# Patient Record
Sex: Female | Born: 1955 | Race: White | Hispanic: No | Marital: Married | Smoking: Never smoker
Health system: Southern US, Community
[De-identification: ages and names within clinical notes are randomized; demographics above are authoritative.]

## PROBLEM LIST (undated history)

## (undated) DIAGNOSIS — C4499 Other specified malignant neoplasm of skin, unspecified: Secondary | ICD-10-CM

## (undated) DIAGNOSIS — C439 Malignant melanoma of skin, unspecified: Secondary | ICD-10-CM

## (undated) DIAGNOSIS — E039 Hypothyroidism, unspecified: Secondary | ICD-10-CM

## (undated) DIAGNOSIS — N200 Calculus of kidney: Secondary | ICD-10-CM

## (undated) DIAGNOSIS — H409 Unspecified glaucoma: Secondary | ICD-10-CM

## (undated) DIAGNOSIS — E119 Type 2 diabetes mellitus without complications: Secondary | ICD-10-CM

## (undated) DIAGNOSIS — N8502 Endometrial intraepithelial neoplasia [EIN]: Secondary | ICD-10-CM

## (undated) DIAGNOSIS — Z803 Family history of malignant neoplasm of breast: Secondary | ICD-10-CM

## (undated) DIAGNOSIS — I10 Essential (primary) hypertension: Secondary | ICD-10-CM

## (undated) DIAGNOSIS — N816 Rectocele: Secondary | ICD-10-CM

## (undated) HISTORY — DX: Malignant melanoma of skin, unspecified: C43.9

## (undated) HISTORY — PX: OTHER SURGICAL HISTORY: SHX169

## (undated) HISTORY — DX: Unspecified glaucoma: H40.9

## (undated) HISTORY — PX: ABDOMINAL HYSTERECTOMY: SHX81

## (undated) HISTORY — DX: Other specified malignant neoplasm of skin, unspecified: C44.99

## (undated) HISTORY — DX: Calculus of kidney: N20.0

## (undated) HISTORY — DX: Family history of malignant neoplasm of breast: Z80.3

## (undated) HISTORY — DX: Hypothyroidism, unspecified: E03.9

## (undated) HISTORY — DX: Rectocele: N81.6

## (undated) HISTORY — PX: OOPHORECTOMY: SHX86

## (undated) HISTORY — DX: Essential (primary) hypertension: I10

---
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
## 2002-04-30 ENCOUNTER — Encounter: Payer: Self-pay | Admitting: Unknown Physician Specialty

## 2002-04-30 ENCOUNTER — Encounter: Admission: RE | Admit: 2002-04-30 | Discharge: 2002-04-30 | Payer: Self-pay | Admitting: Unknown Physician Specialty

## 2002-05-30 ENCOUNTER — Encounter: Payer: Self-pay | Admitting: Neurosurgery

## 2002-06-02 ENCOUNTER — Ambulatory Visit (HOSPITAL_COMMUNITY): Admission: RE | Admit: 2002-06-02 | Discharge: 2002-06-02 | Payer: Self-pay | Admitting: Neurosurgery

## 2002-06-02 ENCOUNTER — Encounter: Payer: Self-pay | Admitting: Neurosurgery

## 2002-06-02 HISTORY — PX: NECK SURGERY: SHX720

## 2005-01-01 ENCOUNTER — Ambulatory Visit: Payer: Self-pay

## 2005-08-27 ENCOUNTER — Emergency Department: Payer: Self-pay | Admitting: Emergency Medicine

## 2005-10-10 ENCOUNTER — Ambulatory Visit: Payer: Self-pay

## 2005-10-22 ENCOUNTER — Ambulatory Visit: Payer: Self-pay | Admitting: Specialist

## 2005-11-04 ENCOUNTER — Ambulatory Visit: Payer: Self-pay | Admitting: Specialist

## 2005-11-24 ENCOUNTER — Ambulatory Visit: Payer: Self-pay | Admitting: Specialist

## 2006-01-21 ENCOUNTER — Ambulatory Visit: Payer: Self-pay | Admitting: Unknown Physician Specialty

## 2007-01-27 ENCOUNTER — Ambulatory Visit: Payer: Self-pay

## 2008-02-02 ENCOUNTER — Ambulatory Visit: Payer: Self-pay

## 2009-02-07 ENCOUNTER — Ambulatory Visit: Payer: Self-pay

## 2010-02-13 ENCOUNTER — Ambulatory Visit: Payer: Self-pay

## 2011-02-19 ENCOUNTER — Ambulatory Visit: Payer: Self-pay

## 2011-02-21 ENCOUNTER — Ambulatory Visit: Payer: Self-pay

## 2012-02-25 ENCOUNTER — Ambulatory Visit: Payer: Self-pay

## 2013-01-06 HISTORY — PX: COLONOSCOPY: SHX174

## 2013-01-07 ENCOUNTER — Ambulatory Visit: Payer: Self-pay | Admitting: Gastroenterology

## 2013-01-10 LAB — PATHOLOGY REPORT

## 2013-03-02 ENCOUNTER — Ambulatory Visit: Payer: Self-pay | Admitting: Family Medicine

## 2014-01-18 LAB — HM PAP SMEAR: HM PAP: NEGATIVE

## 2014-03-03 ENCOUNTER — Ambulatory Visit: Payer: Self-pay | Admitting: Family Medicine

## 2015-01-24 ENCOUNTER — Other Ambulatory Visit: Payer: Self-pay | Admitting: Family Medicine

## 2015-01-24 DIAGNOSIS — Z1231 Encounter for screening mammogram for malignant neoplasm of breast: Secondary | ICD-10-CM

## 2015-03-14 ENCOUNTER — Encounter (INDEPENDENT_AMBULATORY_CARE_PROVIDER_SITE_OTHER): Payer: Self-pay

## 2015-03-14 ENCOUNTER — Ambulatory Visit
Admission: RE | Admit: 2015-03-14 | Discharge: 2015-03-14 | Disposition: A | Discharge status: Home / Self Care | Payer: BLUE CROSS/BLUE SHIELD | Source: Ambulatory Visit | Attending: Family Medicine | Admitting: Family Medicine

## 2015-03-14 DIAGNOSIS — Z1231 Encounter for screening mammogram for malignant neoplasm of breast: Secondary | ICD-10-CM | POA: Insufficient documentation

## 2016-01-18 ENCOUNTER — Other Ambulatory Visit: Payer: Self-pay | Admitting: Family Medicine

## 2016-01-18 DIAGNOSIS — Z1231 Encounter for screening mammogram for malignant neoplasm of breast: Secondary | ICD-10-CM

## 2016-01-30 DIAGNOSIS — Z7189 Other specified counseling: Secondary | ICD-10-CM | POA: Insufficient documentation

## 2016-01-30 DIAGNOSIS — Z7185 Encounter for immunization safety counseling: Secondary | ICD-10-CM | POA: Insufficient documentation

## 2016-03-14 ENCOUNTER — Ambulatory Visit
Admission: RE | Admit: 2016-03-14 | Discharge: 2016-03-14 | Disposition: A | Discharge status: Home / Self Care | Payer: BLUE CROSS/BLUE SHIELD | Source: Ambulatory Visit | Attending: Family Medicine | Admitting: Family Medicine

## 2016-03-14 ENCOUNTER — Other Ambulatory Visit: Payer: Self-pay | Admitting: Family Medicine

## 2016-03-14 DIAGNOSIS — R928 Other abnormal and inconclusive findings on diagnostic imaging of breast: Secondary | ICD-10-CM | POA: Insufficient documentation

## 2016-03-14 DIAGNOSIS — Z1231 Encounter for screening mammogram for malignant neoplasm of breast: Secondary | ICD-10-CM

## 2016-03-14 LAB — HM MAMMOGRAPHY

## 2016-03-18 ENCOUNTER — Other Ambulatory Visit: Payer: Self-pay | Admitting: Family Medicine

## 2016-03-18 DIAGNOSIS — N6320 Unspecified lump in the left breast, unspecified quadrant: Secondary | ICD-10-CM

## 2016-03-31 ENCOUNTER — Ambulatory Visit
Admission: RE | Admit: 2016-03-31 | Discharge: 2016-03-31 | Disposition: A | Discharge status: Home / Self Care | Payer: BLUE CROSS/BLUE SHIELD | Source: Ambulatory Visit | Attending: Family Medicine | Admitting: Family Medicine

## 2016-03-31 DIAGNOSIS — N63 Unspecified lump in breast: Secondary | ICD-10-CM | POA: Diagnosis present

## 2016-03-31 DIAGNOSIS — R928 Other abnormal and inconclusive findings on diagnostic imaging of breast: Secondary | ICD-10-CM | POA: Insufficient documentation

## 2016-03-31 DIAGNOSIS — N6320 Unspecified lump in the left breast, unspecified quadrant: Secondary | ICD-10-CM

## 2016-05-28 LAB — HM PAP SMEAR: HM Pap smear: NEGATIVE

## 2016-06-13 DIAGNOSIS — C439 Malignant melanoma of skin, unspecified: Secondary | ICD-10-CM

## 2016-06-13 HISTORY — DX: Malignant melanoma of skin, unspecified: C43.9

## 2016-07-10 ENCOUNTER — Other Ambulatory Visit: Payer: Self-pay | Admitting: Family Medicine

## 2016-07-14 HISTORY — PX: VULVA /PERINEUM BIOPSY: SHX319

## 2016-08-19 ENCOUNTER — Other Ambulatory Visit: Payer: Self-pay | Admitting: Family Medicine

## 2016-08-19 DIAGNOSIS — R922 Inconclusive mammogram: Secondary | ICD-10-CM

## 2016-09-25 ENCOUNTER — Ambulatory Visit
Admission: RE | Admit: 2016-09-25 | Discharge: 2016-09-25 | Disposition: A | Discharge status: Home / Self Care | Payer: BLUE CROSS/BLUE SHIELD | Source: Ambulatory Visit | Attending: Family Medicine | Admitting: Family Medicine

## 2016-09-25 DIAGNOSIS — Z85820 Personal history of malignant melanoma of skin: Secondary | ICD-10-CM | POA: Insufficient documentation

## 2016-09-25 DIAGNOSIS — Z803 Family history of malignant neoplasm of breast: Secondary | ICD-10-CM | POA: Insufficient documentation

## 2016-09-25 DIAGNOSIS — R922 Inconclusive mammogram: Secondary | ICD-10-CM | POA: Insufficient documentation

## 2016-10-29 DIAGNOSIS — Z6835 Body mass index (BMI) 35.0-35.9, adult: Secondary | ICD-10-CM

## 2016-10-29 DIAGNOSIS — L719 Rosacea, unspecified: Secondary | ICD-10-CM | POA: Insufficient documentation

## 2016-10-29 DIAGNOSIS — G4730 Sleep apnea, unspecified: Secondary | ICD-10-CM | POA: Insufficient documentation

## 2016-10-29 DIAGNOSIS — I10 Essential (primary) hypertension: Secondary | ICD-10-CM | POA: Insufficient documentation

## 2016-10-29 DIAGNOSIS — E6609 Other obesity due to excess calories: Secondary | ICD-10-CM | POA: Insufficient documentation

## 2016-10-29 DIAGNOSIS — E7800 Pure hypercholesterolemia, unspecified: Secondary | ICD-10-CM | POA: Insufficient documentation

## 2016-10-29 DIAGNOSIS — H409 Unspecified glaucoma: Secondary | ICD-10-CM | POA: Insufficient documentation

## 2016-10-29 DIAGNOSIS — E039 Hypothyroidism, unspecified: Secondary | ICD-10-CM | POA: Insufficient documentation

## 2016-10-30 ENCOUNTER — Ambulatory Visit (INDEPENDENT_AMBULATORY_CARE_PROVIDER_SITE_OTHER): Payer: BLUE CROSS/BLUE SHIELD | Admitting: Obstetrics and Gynecology

## 2016-10-30 ENCOUNTER — Encounter: Payer: Self-pay | Admitting: Obstetrics and Gynecology

## 2016-10-30 VITALS — BP 126/52 | HR 77 | Ht 60.0 in | Wt 189.0 lb

## 2016-10-30 DIAGNOSIS — K5901 Slow transit constipation: Secondary | ICD-10-CM | POA: Diagnosis not present

## 2016-10-30 DIAGNOSIS — N760 Acute vaginitis: Secondary | ICD-10-CM | POA: Diagnosis not present

## 2016-10-30 LAB — POCT WET PREP WITH KOH
Clue Cells Wet Prep HPF POC: NEGATIVE
KOH PREP POC: NEGATIVE
Trichomonas, UA: NEGATIVE
YEAST WET PREP PER HPF POC: NEGATIVE

## 2016-10-30 MED ORDER — CLOTRIMAZOLE-BETAMETHASONE 1-0.05 % EX CREA: 1.0000 "application " | TOPICAL_CREAM | Freq: Two times a day (BID) | CUTANEOUS | 0 refills | Status: DC

## 2016-10-30 NOTE — Progress Notes (Signed)
Chief Complaint  Patient presents with  . Vaginitis    vaginal burning/itching   . Pelvic Pain     HPI:      Ms. Laura Crane is a 61 y.o. G1P0010 who LMP was No LMP recorded. Patient is postmenopausal., presents today for vaginal irritation/itch/redness off and on for the past few wks. No increased d/c, odor. She has done 2 rounds of abx since 1/18. She has not used any meds to treat. She was eval by PCP for LLQ pain/hematuria with neg eval. Pt still has occas BLQ cramping but is having constipation now (after abx use). Sx improved with metamucil use.  Past Medical History:  Diagnosis Date  . Glaucoma   . Hypertension   . Hypothyroidism     Past Surgical History:  Procedure Laterality Date  . COLONOSCOPY  01/06/2013   3 polyps  . NECK SURGERY  06/02/2002   two discs replaced    Family History  Problem Relation Age of Onset  . Breast cancer Sister   . Breast cancer Sister 95  . Breast cancer Paternal Aunt 68  . Hypertension Mother   . Hypertension Father   . Diabetes Brother   . Migraines Brother      ROS:  Review of Systems  Constitutional: Negative for fever, malaise/fatigue and weight loss.  Gastrointestinal: Positive for constipation. Negative for blood in stool, diarrhea, nausea and vomiting.  Genitourinary: Negative for dysuria, flank pain, frequency, hematuria and urgency.  Musculoskeletal: Negative for back pain.  Skin: Positive for itching and rash.    Objective: BP (!) 126/52 (BP Location: Left Arm, Patient Position: Sitting, Cuff Size: Normal)   Pulse 77   Ht 5' (1.524 m)   Wt 189 lb (85.7 kg)   BMI 36.91 kg/m    Physical Exam  Genitourinary: Vagina normal and uterus normal.  There is rash and tenderness on the right labia. There is no lesion or Bartholin's cyst on the right labia.  There is rash and tenderness on the left labia. There is no lesion or Bartholin's cyst on the left labia. No erythema, tenderness or bleeding in the vagina. No  vaginal discharge found. Right adnexum does not display mass and does not display tenderness. Left adnexum does not display mass and does not display tenderness. Cervix does not exhibit motion tenderness, discharge or friability. Uterus is not enlarged or tender.  Nursing note and vitals reviewed.   Results: Results for orders placed or performed in visit on 10/30/16 (from the past 24 hour(s))  POCT Wet Prep with KOH     Status: Normal   Collection Time: 10/30/16 10:32 AM  Result Value Ref Range   Trichomonas, UA Negative    Clue Cells Wet Prep HPF POC neg    Epithelial Wet Prep HPF POC  Few, Moderate, Many, Too numerous to count   Yeast Wet Prep HPF POC neg    Bacteria Wet Prep HPF POC  Few   RBC Wet Prep HPF POC     WBC Wet Prep HPF POC     KOH Prep POC Negative Negative     Assessment/Plan:  Acute vaginitis - Neg wet prep/pos exam. Rx clotrimazole/betamethasone crm/cold compresses. F/u prn.  - Plan: clotrimazole-betamethasone (LOTRISONE) cream, POCT Wet Prep with KOH  Slow transit constipation - Cont metamucil. Add probiotics for 1 mo since completed 2 doses abx recently.    F/U  Return if symptoms worsen or fail to improve.  Amal Renbarger B. Elyanna Wallick, PA-C 10/30/2016 10:34 AM

## 2017-02-11 DIAGNOSIS — C4499 Other specified malignant neoplasm of skin, unspecified: Secondary | ICD-10-CM

## 2017-02-11 HISTORY — DX: Other specified malignant neoplasm of skin, unspecified: C44.99

## 2017-03-04 ENCOUNTER — Ambulatory Visit (INDEPENDENT_AMBULATORY_CARE_PROVIDER_SITE_OTHER): Payer: BLUE CROSS/BLUE SHIELD | Admitting: Certified Nurse Midwife

## 2017-03-04 ENCOUNTER — Encounter: Payer: Self-pay | Admitting: Certified Nurse Midwife

## 2017-03-04 VITALS — BP 108/62 | HR 71 | Ht 61.75 in | Wt 182.0 lb

## 2017-03-04 DIAGNOSIS — C4499 Other specified malignant neoplasm of skin, unspecified: Secondary | ICD-10-CM | POA: Diagnosis not present

## 2017-03-04 DIAGNOSIS — N816 Rectocele: Secondary | ICD-10-CM | POA: Diagnosis not present

## 2017-03-04 DIAGNOSIS — D0361 Melanoma in situ of right upper limb, including shoulder: Secondary | ICD-10-CM | POA: Insufficient documentation

## 2017-03-04 DIAGNOSIS — N200 Calculus of kidney: Secondary | ICD-10-CM | POA: Insufficient documentation

## 2017-03-04 NOTE — Patient Instructions (Signed)
About Rectocele  Overview  A rectocele is a type of hernia which causes different degrees of bulging of the rectal tissues into the vaginal wall.  You may even notice that it presses against the vaginal wall so much that some vaginal tissues droop outside of the opening of your vagina.  Causes of Rectocele  The most common cause is childbirth.  The muscles and ligaments in the pelvis that hold up and support the female organs and vagina become stretched and weakened during labor and delivery.  The more babies you have, the more the support tissues are stretched and weakened.  Not everyone who has a baby will develop a rectocele.  Some women have stronger supporting tissue in the pelvis and may not have as much of a problem as others.  Women who have a Cesarean section usually do not get rectocele's unless they pushed a long time prior to the cesarean delivery.  Other conditions that can cause a rectocele include chronic constipation, a chronic cough, a lot of heavy lifting, and obesity.  Older women may have this problem because the loss of female hormones causes the vaginal tissue to become weaker.  Symptoms  There may not be any symptoms.  If you do have symptoms, they may include:  Pelvic pressure in the rectal area  Protrusion of the lower part of the vagina through the opening of the vagina  Constipation and trapping of the stool, making it difficult to have a bowel movement.  In severe cases, you may have to press on the lower part of your vagina to help push the stool out of you rectum.  This is called splinting to empty.  Diagnosing Rectocele  Your health care provider will ask about your symptoms and perform a pelvic exam.  S/he will ask you to bear down, pushing like you are having a bowel movement so as to see how far the lower part of the vagina protrudes into the vagina and possible outside of the vagina.  Your provider will also ask you to contract the muscles of your pelvis  (like you are stopping the stream in the middle of urinating) to determine the strength of your pelvic muscles.  Your provider may also do a rectal exam.  Treatment Options  If you do not have any symptoms, no treatment may be necessary.  Other treatment options include:  Pelvic floor exercises: Contracting the muscles in your genital area may help strengthen your muscles and support the organs.  Be sure to get proper exercise instruction from you physical therapist.  A pessary (removealbe pelvic support device) sometimes helps rectocele symptoms.  Surgery: Surgical repair may be necessary. In some cases the uterus may need to be taken out ( a hysterectomy) as well.  There are many types of surgery for pelvic support problems.  Look for physicians who specialize in repair procedures.  You can take care of yourself by:  Treating and preventing constipation  Avoiding heavy lifting, and lifting correctly (with your legs, not with you waist or back)  Treating a chronic cough or bronchitis  Not smoking  avoiding too much weight gain  Doing pelvic floor exercises   2007, [employer] Doc.33 Pelvic Organ Prolapse Pelvic organ prolapse is the stretching, bulging, or dropping of pelvic organs into an abnormal position. It happens when the muscles and tissues that surround and support pelvic structures are stretched or weak. Pelvic organ prolapse can involve:  Vagina (vaginal prolapse).  Uterus (uterine prolapse).  Bladder (  cystocele).  Rectum (rectocele).  Intestines (enterocele).  When organs other than the vagina are involved, they often bulge into the vagina or protrude from the vagina, depending on how severe the prolapse is. What are the causes? Causes of this condition include:  Pregnancy, labor, and childbirth.  Long-lasting (chronic) cough.  Chronic constipation.  Obesity.  Past pelvic surgery.  Aging. During and after menopause, a decreased  production of the hormone estrogen can weaken pelvic ligaments and muscles.  Consistently lifting more than 50 lb (23 kg).  Buildup of fluid in the abdomen due to certain diseases and other conditions.  What are the signs or symptoms? Symptoms of this condition include:  Loss of bladder control when you cough, sneeze, strain, and exercise (stress incontinence). This may be worse immediately following childbirth, and it may gradually improve over time.  Feeling pressure in your pelvis or vagina. This pressure may increase when you cough or when you are having a bowel movement.  A bulge that protrudes from the opening of your vagina or against your vaginal wall. If your uterus protrudes through the opening of your vagina and rubs against your clothing, you may also experience soreness, ulcers, infection, pain, and bleeding.  Increased effort to have a bowel movement or urinate.  Pain in your low back.  Pain, discomfort, or disinterest in sexual intercourse.  Repeated bladder infections (urinary tract infections).  Difficulty inserting or inability to insert a tampon or applicator.  In some people, this condition does not cause any symptoms. How is this diagnosed? Your health care provider may perform an internal and external vaginal and rectal exam. During the exam, you may be asked to cough and strain while you are lying down, sitting, and standing up. Your health care provider will determine if other tests are required, such as bladder function tests. How is this treated? In most cases, this condition needs to be treated only if it produces symptoms. No treatment is guaranteed to correct the prolapse or relieve the symptoms completely. Treatment may include:  Lifestyle changes, such as: ? Avoiding drinking beverages that contain caffeine. ? Increasing your intake of high-fiber foods. This can help to decrease constipation and straining during bowel movements. ? Emptying your bladder  at scheduled times (bladder training therapy). This can help to reduce or avoid urinary incontinence. ? Losing weight if you are overweight or obese.  Estrogen. Estrogen may help mild prolapse by increasing the strength and tone of pelvic floor muscles.  Kegel exercises. These may help mild cases of prolapse by strengthening and tightening the muscles of the pelvic floor.  Pessary insertion. A pessary is a soft, flexible device that is placed into your vagina by your health care provider to help support the vaginal walls and keep pelvic organs in place.  Surgery. This is often the only form of treatment for severe prolapse. Different types of surgeries are available.  Follow these instructions at home:  Wear a sanitary pad or absorbent product if you have urinary incontinence.  Avoid heavy lifting and straining with exercise and work. Do not hold your breath when you perform mild to moderate lifting and exercise activities. Limit your activities as directed by your health care provider.  Take medicines only as directed by your health care provider.  Perform Kegel exercises as directed by your health care provider.  If you have a pessary, take care of it as directed by your health care provider. Contact a health care provider if:  Your symptoms interfere  with your daily activities or sex life.  You need medicine to help with the discomfort.  You notice bleeding from the vagina that is not related to your period.  You have a fever.  You have pain or bleeding when you urinate.  You have bleeding when you have a bowel movement.  You lose urine when you have sex.  You have chronic constipation.  You have a pessary that falls out.  You have vaginal discharge that has a bad smell.  You have low abdominal pain or cramping that is unusual for you. This information is not intended to replace advice given to you by your health care provider. Make sure you discuss any questions you  have with your health care provider. Document Released: 01/25/2014 Document Revised: 12/06/2015 Document Reviewed: 09/12/2013 Elsevier Interactive Patient Education  [employer].

## 2017-03-04 NOTE — Progress Notes (Signed)
Obstetrics & Gynecology Office Visit   Chief Complaint:  Chief Complaint  Patient presents with  . 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 Her symptoms began in March with vulvar itching and redness following antibiotic usage. Her sx did not improve with topical clobetasol. She then was seen at a dermatologist who biopsied the right labia and it returned extramammary Paget's. She is seeing Dr Thurston Pounds at [facility], gyn onc, and is scheduled for a partial vulvectomy next week. She reports that her vulva burns most of the time. A urinalysis done yesterday was negative for a UTI. Her last annual exam was 05/2016 She has had a colonoscopy 4 years ago (3 polyps removed) and is due for another next year. Her mammogram was done 03/2016 and she had additional views of the left breast for a lesion thought to be a benign fat necrosis. 6 mos follow up studies were also done in March. She is due for a annual screening mammogram next month. She reports having a wide excision of a melanoma of her right arm December 2017 and she passed kidney stones in Jan 2018.  She states that she noticed this bulging at her vaginal opening recently and wanted to know what it was. She has been having problems with constipation and bilateral lower abdominal cramping since earlier in the year.    Review of Systems:  ROS -see HPI  Past Medical History:  Past Medical History:  Diagnosis Date  . Extramammary Paget disease 02/2017   having surgery to remove 03/11/17 from genital area  . Glaucoma   . Hypertension   . Hypothyroidism   . Kidney stones   . Melanoma ([place]) 06/2016   right arm    Past Surgical History:  Past Surgical History:  Procedure Laterality Date  . COLONOSCOPY  01/06/2013   3 polyps  . excision  of melanoma Right    right arm  . NECK SURGERY  06/02/2002   two discs replaced  . VULVA /PERINEUM BIOPSY  2018   extramammary Paget's    Gynecologic History: No LMP recorded. Patient is postmenopausal.   Family History:  Family History  Problem Relation Age of Onset  . Breast cancer Sister 77  . Breast cancer Paternal Aunt 12  . Hypertension Mother   . Kidney failure Mother   . Hypertension Father   . Lung cancer Father   . Diabetes Brother   . Migraines Brother   . Hypertension Brother   . Heart attack Sister     Social History:  Social History   Social History  . Marital status: Married    Spouse name: N/A  . Number of children: N/A  . Years of education: N/A   Occupational History  . Not on file.   Social History Main Topics  . Smoking status: Never Smoker  . Smokeless tobacco: Never Used  . Alcohol use No  . Drug use: No  . Sexual activity: No   Other Topics Concern  . Not on file   Social History Narrative  . No narrative on file    Allergies:  Allergies  Allergen Reactions  . Azithromycin Swelling  . Doxycycline Anaphylaxis  . Doxycycline Calcium Anaphylaxis  . Erythromycin Other (See Comments)    Caused knee & lip  swelling  . Hydrochlorothiazide Other (See Comments)    Rash on tongue  . Maprotiline     Other reaction(s): Unknown  . Amlodipine Rash  . Brimonidine Itching  . Macrolides And Ketolides Rash and Other (See Comments)  . Penicillin V Potassium Rash  . Penicillins Rash  . Sulfa Antibiotics Rash  . Tetracyclines & Related Rash  . Troleandomycin Rash    Medications: Prior to Admission medications   Medication Sig Start Date End Date Taking? Authorizing Provider  aspirin EC 81 MG tablet Take by mouth.   Yes [provider]  bimatoprost (LUMIGAN) 0.01 % SOLN 1 drop at bedtime.   Yes [provider]  levothyroxine (SYNTHROID, LEVOTHROID) 100 MCG tablet Take by mouth.   Yes [provider]  lisinopril  (PRINIVIL,ZESTRIL) 10 MG tablet Take by mouth. 09/04/16  Yes [provider]  Multiple Vitamins-Minerals (PRESERVISION AREDS 2+MULTI VIT PO) Take by mouth.   Yes [provider]  Omega-3 Fatty Acids (FISH OIL) 1000 MG CAPS Take by mouth.   Yes [provider]  Red Yeast Rice Extract 600 MG CAPS Take by mouth.   Yes [provider]  timolol (BETIMOL) 0.5 % ophthalmic solution Apply to eye.   Yes [provider]  verapamil (CALAN-SR) 240 MG CR tablet Take by mouth. 09/04/16  Yes [provider]    Physical Exam Vitals:BP 108/62   Pulse 71   Ht 5' 1.75" (1.568 m)   Wt 182 lb (82.6 kg)   BMI 33.56 kg/m    Physical Exam  Constitutional: She is oriented to person, place, and time. She appears well-developed and well-nourished. No distress.  Genitourinary:  Genitourinary Comments: Vulva: external labia majora appear red; there is a ulcerated lesion at the top of the Inner right labia majora. Vagina: rectocele noted with Valsalva Cervix: no lesions  Neurological: She is alert and oriented to person, place, and time.  Skin: Skin is warm and dry.  Psychiatric: She has a normal mood and affect. Her behavior is normal.     Assessment: 61 y.o. G1P0010 postmenopausal female with rectocele/ constipation. I do not think that her vulvar discomfort is from her rectocele, but rather from the Paget's   Extramammary Paget's-scheduled for a vulvectomy next week.   Plan: Discussed medical treatment of rectocele and constipation with Benefiber daily at this time. Can also do Kegel exercises. Further work up for underlying carcinomas associated with Paget's disease per GYN Oncologist. Advised patient to use cool soaks on her vulva or sitz baths to see if it will help her vulvar discomfort  Laura Crane, CNM

## 2017-03-09 ENCOUNTER — Other Ambulatory Visit: Payer: Self-pay | Admitting: Family Medicine

## 2017-03-09 DIAGNOSIS — Z1231 Encounter for screening mammogram for malignant neoplasm of breast: Secondary | ICD-10-CM

## 2017-03-11 HISTORY — PX: OTHER SURGICAL HISTORY: SHX169

## 2017-04-01 ENCOUNTER — Ambulatory Visit
Admission: RE | Admit: 2017-04-01 | Discharge: 2017-04-01 | Disposition: A | Discharge status: Home / Self Care | Payer: BLUE CROSS/BLUE SHIELD | Source: Ambulatory Visit | Attending: Family Medicine | Admitting: Family Medicine

## 2017-04-01 DIAGNOSIS — Z1231 Encounter for screening mammogram for malignant neoplasm of breast: Secondary | ICD-10-CM | POA: Insufficient documentation

## 2017-04-27 ENCOUNTER — Ambulatory Visit
Admission: RE | Admit: 2017-04-27 | Discharge: 2017-04-27 | Disposition: A | Discharge status: Home / Self Care | Payer: BLUE CROSS/BLUE SHIELD | Source: Ambulatory Visit | Attending: Family Medicine | Admitting: Family Medicine

## 2017-04-27 ENCOUNTER — Other Ambulatory Visit: Payer: Self-pay | Admitting: Family Medicine

## 2017-04-27 DIAGNOSIS — M4316 Spondylolisthesis, lumbar region: Secondary | ICD-10-CM | POA: Diagnosis not present

## 2017-04-27 DIAGNOSIS — K5730 Diverticulosis of large intestine without perforation or abscess without bleeding: Secondary | ICD-10-CM | POA: Diagnosis not present

## 2017-04-27 DIAGNOSIS — R109 Unspecified abdominal pain: Secondary | ICD-10-CM

## 2017-04-27 DIAGNOSIS — N3289 Other specified disorders of bladder: Secondary | ICD-10-CM | POA: Insufficient documentation

## 2017-04-27 DIAGNOSIS — R935 Abnormal findings on diagnostic imaging of other abdominal regions, including retroperitoneum: Secondary | ICD-10-CM | POA: Diagnosis not present

## 2017-04-27 MED ORDER — IOPAMIDOL (ISOVUE-300) INJECTION 61%
100.0000 mL | Freq: Once | INTRAVENOUS | Status: AC | PRN
  Administered 2017-04-27: 100 mL via INTRAVENOUS

## 2017-05-19 ENCOUNTER — Ambulatory Visit: Payer: Self-pay | Admitting: Urology

## 2017-05-21 ENCOUNTER — Ambulatory Visit: Payer: Self-pay | Admitting: Urology

## 2017-06-29 HISTORY — PX: COLONOSCOPY W/ POLYPECTOMY: SHX1380

## 2017-08-17 HISTORY — PX: RECTAL PROLAPSE REPAIR: SHX759

## 2017-11-16 ENCOUNTER — Encounter: Payer: Self-pay | Admitting: Certified Nurse Midwife

## 2017-11-16 ENCOUNTER — Ambulatory Visit (INDEPENDENT_AMBULATORY_CARE_PROVIDER_SITE_OTHER): Payer: BLUE CROSS/BLUE SHIELD | Admitting: Certified Nurse Midwife

## 2017-11-16 VITALS — BP 142/82 | HR 75 | Ht 61.0 in | Wt 195.0 lb

## 2017-11-16 DIAGNOSIS — Z1231 Encounter for screening mammogram for malignant neoplasm of breast: Secondary | ICD-10-CM | POA: Diagnosis not present

## 2017-11-16 DIAGNOSIS — Z124 Encounter for screening for malignant neoplasm of cervix: Secondary | ICD-10-CM | POA: Diagnosis not present

## 2017-11-16 DIAGNOSIS — Z0000 Encounter for general adult medical examination without abnormal findings: Secondary | ICD-10-CM | POA: Diagnosis not present

## 2017-11-16 DIAGNOSIS — Z01419 Encounter for gynecological examination (general) (routine) without abnormal findings: Secondary | ICD-10-CM

## 2017-11-16 DIAGNOSIS — Z1239 Encounter for other screening for malignant neoplasm of breast: Secondary | ICD-10-CM

## 2017-11-16 NOTE — Progress Notes (Signed)
Gynecology Annual Exam  PCP: Katheren Shams  Chief Complaint:  Chief Complaint  Patient presents with  . Gynecologic Exam    History of Present Illness:Laura Crane presents today for her annual exam. She is a 62 year old Caucasian/White female , G 0 P 0 0 0 0 , who is postmenopausal . 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 She did not need radiation of chemotherapy. In February of this year she also had a rectocele repair (posterior colporrhaphy) and feels like she has healed well since her surgeries. Her menses are absent and she is postmenopausal. She does have occasional hot flashes.  She has had no spotting.   The patient's past medical history is also remarkable for hypothyroidism, high blood pressure, and glaucoma.  Since her last annual GYN exam dated 05/28/2016, she has had a melanoma removed from her right upper arm in December 2017 and passed a kidney stone in January 2018.[name] She is not sexually active.   Her most recent pap smear was obtained 05/28/2016 and was NIL.  Her most recent mammogram obtained on 04/01/2017 was negative. Her prior mammogram in 2017 revealed an abnormality (as described in the report) and Additional views 03/31/2016 reveal a fat necosis in the left breast. 61mos follow up mammogram was Bir[facility] 2 and stable.  There is a positive history of breast cancer in her sister and paternal aunt. Genetic testing has not been done. There is no family history of ovarian cancer. The patient does not do monthly self breast exams.  She had a colonoscopy in 06/29/2017. Results: tubular adenoma. Her next colonoscopy is due in 5 years.  She denies a recent DEXA scan and is eligible.  The patient does not smoke.  The patient does not drink alcohol.  The patient does not use illegal drugs.  The patient exercises on the treadmill three times a week for 20-30 minutes and works in the yard. The  patient does get adequate calcium in her diet.  She had a recent cholesterol screen in 2018 by Dr Richarda Overlie that was OK, except for a low HDL.   Review of Systems: Review of Systems  Constitutional: Positive for malaise/fatigue. Negative for chills, fever and weight loss.  HENT: Negative for congestion, sinus pain and sore throat.   Eyes: Negative for blurred vision and pain.  Respiratory: Negative for hemoptysis, shortness of breath and wheezing.   Cardiovascular: Negative for chest pain, palpitations and leg swelling.  Gastrointestinal: Negative for abdominal pain, blood in stool, diarrhea, heartburn, nausea and vomiting.  Genitourinary: Negative for dysuria, frequency, hematuria and urgency.  Musculoskeletal: Negative for back pain, joint pain and myalgias.  Skin: Negative for itching and rash.  Neurological: Negative for dizziness, tingling and headaches.  Endo/Heme/Allergies: Positive for environmental allergies (with coughing and sneezing). Negative for polydipsia. Bruises/bleeds easily.       Negative for hirsutism   Psychiatric/Behavioral: Negative for depression. The patient is not nervous/anxious and does not have insomnia.     Past Medical History:  Past Medical History:  Diagnosis Date  . Extramammary Paget disease 02/2017   having surgery to remove 03/11/17 from genital area  . Glaucoma   . Hypertension   . Hypothyroidism   . Kidney stones   . Melanoma ([place]) 06/2016   right arm  . Rectocele     Past Surgical History:  Past Surgical History:  Procedure Laterality Date  . COLONOSCOPY  01/06/2013  3 polyps  . COLONOSCOPY W/ POLYPECTOMY  06/29/2017   tubular adenoma-next colonoscopy in 5 years  . excision of melanoma Right    right arm  . NECK SURGERY  06/02/2002   two discs replaced  . RECTAL PROLAPSE REPAIR  08/17/2017  . Simple partial vulvectomy  03/11/2017  . VULVA /PERINEUM BIOPSY  2018   extramammary Paget's    Family History:  Family History    Problem Relation Age of Onset  . Breast cancer Sister 25  . Breast cancer Paternal Aunt 2  . Hypertension Mother   . Kidney failure Mother   . Hypertension Father   . Lung cancer Father   . Diabetes Brother   . Migraines Brother   . Hypertension Brother   . Heart attack Sister     Social History:  Social History   Socioeconomic History  . Marital status: Married    Spouse name: Not on file  . Number of children: Not on file  . Years of education: Not on file  . Highest education level: Not on file  Occupational History  . Not on file  Social Needs  . Financial resource strain: Not on file  . Food insecurity:    Worry: Not on file    Inability: Not on file  . Transportation needs:    Medical: Not on file    Non-medical: Not on file  Tobacco Use  . Smoking status: Never Smoker  . Smokeless tobacco: Never Used  Substance and Sexual Activity  . Alcohol use: No  . Drug use: No  . Sexual activity: Not Currently  Lifestyle  . Physical activity:    Days per week: 3 days    Minutes per session: 30 min  . Stress: Only a little  Relationships  . Social connections:    Talks on phone: Not on file    Gets together: Not on file    Attends religious service: Not on file    Active member of club or organization: Not on file    Attends meetings of clubs or organizations: Not on file    Relationship status: Not on file  . Intimate partner violence:    Fear of current or ex partner: Not on file    Emotionally abused: Not on file    Physically abused: Not on file    Forced sexual activity: Not on file  Other Topics Concern  . Not on file  Social History Narrative  . Not on file    Allergies:  Allergies  Allergen Reactions  . Azithromycin Swelling  . Doxycycline Anaphylaxis  . Doxycycline Calcium Anaphylaxis  . Erythromycin Other (See Comments)    Caused knee & lip swelling  . Maprotiline     Other reaction(s): Unknown Other reaction(s): Unknown  . Amlodipine  Rash  . Brimonidine Itching  . Hydrochlorothiazide Other (See Comments) and Rash    Rash on tongue Other reaction(s): Other (See Comments) Rash on tongue Rash on tongue Rash on tongue  . Macrolides And Ketolides Rash and Other (See Comments)    Other reaction(s): Other (See Comments), Unknown  . Penicillin V Potassium Rash  . Penicillins Rash  . Sulfa Antibiotics Rash  . Tetracyclines & Related Rash  . Troleandomycin Rash    Other reaction(s): Other (See Comments)    Medications: Prior to Admission medications   Medication Sig Start Date End Date Taking? Authorizing Provider  aspirin EC 81 MG tablet Take by mouth.   Yes [provider]  bimatoprost (LUMIGAN) 0.01 % SOLN 1 drop at bedtime.   Yes [provider]  levothyroxine (SYNTHROID, LEVOTHROID) 100 MCG tablet Take by mouth.   Yes [provider]  lisinopril (PRINIVIL,ZESTRIL) 10 MG tablet Take by mouth. 09/04/16  Yes [provider]  Multiple Vitamins-Minerals (ICAPS AREDS 2) CAPS Take by mouth.   Yes [provider]  Omega-3 Fatty Acids (FISH OIL) 1000 MG CAPS Take by mouth.   Yes [provider]  Red Yeast Rice Extract 600 MG CAPS Take by mouth.   Yes [provider]  timolol (BETIMOL) 0.5 % ophthalmic solution Apply to eye.   Yes [provider]  verapamil (CALAN-SR) 240 MG CR tablet Take by mouth. 09/04/16  Yes [provider]  vitamin B-12 (CYANOCOBALAMIN) 1000 MCG tablet Take by mouth.   Yes [provider]    Physical Exam Vitals: Blood pressure (!) 142/82, pulse 75, height 5\' 1"  (1.549 m), weight 195 lb (88.5 kg).  General: NAD HEENT: normocephalic, anicteric Neck: no thyroid enlargement, no palpable nodules, no cervical lymphadenopathy  Pulmonary: No increased work of breathing, CTAB Cardiovascular: RRR, without murmur  Breast: Breast symmetrical, no tenderness, no palpable nodules or masses, no skin or nipple retraction  present, no nipple discharge.  No axillary, infraclavicular or supraclavicular lymphadenopathy. Abdomen: Soft, non-tender, non-distended.  Umbilicus without lesions.  No hepatomegaly or masses palpable. No evidence of hernia. Genitourinary:  External: Appropriate for age, no lesions or inflammation.  Normal urethral meatus, normal  Bartholin's and Skene's glands.    Vagina:decreased rugae, no evidence of prolapse.    Cervix: Grossly normal in appearance, no bleeding, non-tender  Uterus: Midplane, normal size, shape, and consistency, mobile, and non-tender  Adnexa: No adnexal masses, non-tender  Rectal: deferred  Lymphatic: no evidence of inguinal lymphadenopathy Extremities: no edema, erythema, or tenderness Neurologic: Grossly intact Psychiatric: mood appropriate, affect full     Assessment: 62 y.o. G1P0010 normal postmenopausal gyn exam  Plan:    1) Breast cancer screening - recommend monthly self breast exam and annual screening mammograms.. Mammogram due in Sept 2019  2) Osteoporosis prevention: Discussed calcium and vitamin D3 requirements. Also discussed the importance of strength training. Ask PCP about DEXA scan timing.  3) Cervical cancer screening - Pap was done.   4) Colon cancer screening-colonoscopy UTD  5) Routine healthcare maintenance including cholesterol and diabetes screening managed by PCP   6) RTO in 1 year and prn.  Dalia Heading, CNM

## 2017-11-19 LAB — IGP, APTIMA HPV
HPV APTIMA: NEGATIVE
PAP Smear Comment: 0

## 2017-11-24 ENCOUNTER — Encounter (INDEPENDENT_AMBULATORY_CARE_PROVIDER_SITE_OTHER): Payer: Self-pay

## 2017-12-02 ENCOUNTER — Encounter: Payer: Self-pay | Admitting: Certified Nurse Midwife

## 2017-12-03 ENCOUNTER — Encounter: Payer: Self-pay | Admitting: Certified Nurse Midwife

## 2018-04-09 ENCOUNTER — Ambulatory Visit
Admission: RE | Admit: 2018-04-09 | Discharge: 2018-04-09 | Disposition: A | Discharge status: Home / Self Care | Payer: BLUE CROSS/BLUE SHIELD | Source: Ambulatory Visit | Attending: Certified Nurse Midwife | Admitting: Certified Nurse Midwife

## 2018-04-09 DIAGNOSIS — Z1239 Encounter for other screening for malignant neoplasm of breast: Secondary | ICD-10-CM

## 2018-04-09 DIAGNOSIS — Z1231 Encounter for screening mammogram for malignant neoplasm of breast: Secondary | ICD-10-CM | POA: Insufficient documentation

## 2018-04-29 ENCOUNTER — Other Ambulatory Visit: Payer: Self-pay | Admitting: Physician Assistant

## 2018-04-29 DIAGNOSIS — K1121 Acute sialoadenitis: Secondary | ICD-10-CM

## 2018-04-30 ENCOUNTER — Other Ambulatory Visit: Payer: Self-pay | Admitting: Neurology

## 2018-04-30 DIAGNOSIS — K146 Glossodynia: Secondary | ICD-10-CM

## 2018-05-06 ENCOUNTER — Ambulatory Visit
Admission: RE | Admit: 2018-05-06 | Discharge: 2018-05-06 | Disposition: A | Discharge status: Home / Self Care | Payer: BLUE CROSS/BLUE SHIELD | Source: Ambulatory Visit | Attending: Physician Assistant | Admitting: Physician Assistant

## 2018-05-06 DIAGNOSIS — K1121 Acute sialoadenitis: Secondary | ICD-10-CM | POA: Insufficient documentation

## 2018-05-19 ENCOUNTER — Ambulatory Visit
Admission: RE | Admit: 2018-05-19 | Discharge: 2018-05-19 | Disposition: A | Discharge status: Home / Self Care | Payer: BLUE CROSS/BLUE SHIELD | Source: Ambulatory Visit | Attending: Neurology | Admitting: Neurology

## 2018-05-19 DIAGNOSIS — K146 Glossodynia: Secondary | ICD-10-CM

## 2018-05-19 MED ORDER — GADOBENATE DIMEGLUMINE 529 MG/ML IV SOLN
17.0000 mL | Freq: Once | INTRAVENOUS | Status: AC | PRN
  Administered 2018-05-19: 17 mL via INTRAVENOUS

## 2018-06-13 DIAGNOSIS — N8502 Endometrial intraepithelial neoplasia [EIN]: Secondary | ICD-10-CM

## 2018-06-13 HISTORY — DX: Endometrial intraepithelial neoplasia (EIN): N85.02

## 2018-08-09 HISTORY — PX: TOTAL LAPAROSCOPIC HYSTERECTOMY WITH BILATERAL SALPINGO OOPHORECTOMY: SHX6845

## 2018-11-20 IMAGING — MR MR HEAD WO/W CM
7 series · 48 of 48 positions shown · IV contrast (multihance)
Comparison: None.

CLINICAL DATA: Tongue burning sensation since September 2017. History
of traumatic brain injury, melanoma, hypertension.

EXAM:
MRI HEAD WITHOUT AND WITH CONTRAST
TECHNIQUE: Multiplanar, multiecho pulse sequences of the brain and surrounding
structures were obtained without and with intravenous contrast.
CONTRAST:  17mL MULTIHANCE GADOBENATE DIMEGLUMINE 529 MG/ML IV SOLN

[Series 5: T1 · sagittal · 4.0mm · 0.75mm/px · 5 of 31 slices shown]
[im 1/31]
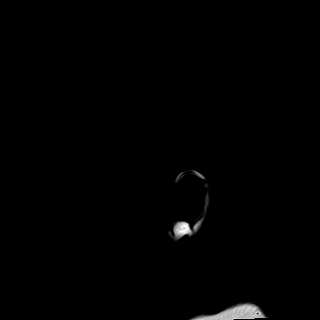
[im 8/31]
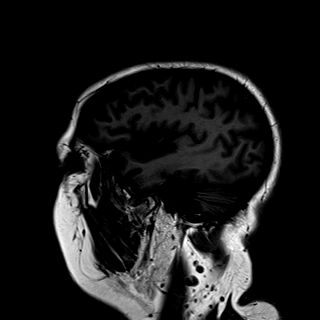
[im 16/31]
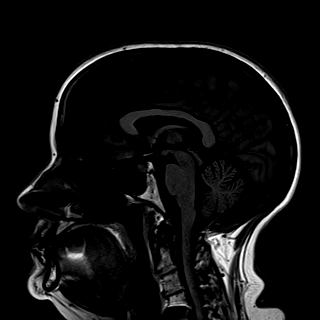
[im 23/31]
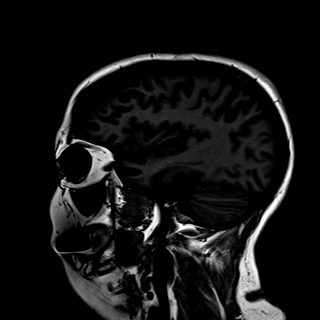
[im 31/31]
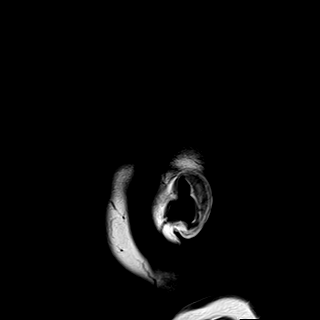

[Series 6: DWI · axial · 3.0mm · 1.44mm/px · z∈[-46,+90]mm · 13 of 86 slices shown (1 of 4)]
[im 1/86]
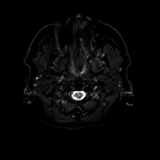
[im 8/86]
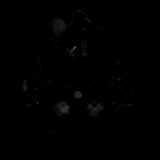
[im 15/86]
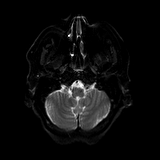
[im 22/86]
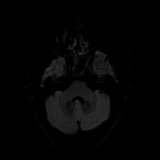
[im 29/86]
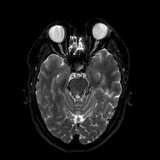
[im 36/86]
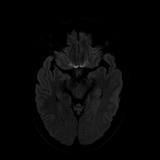
[im 43/86]
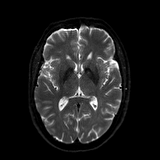
[im 50/86]
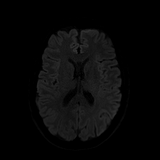
[im 57/86]
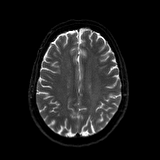
[im 64/86]
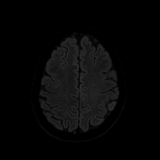
[im 71/86]
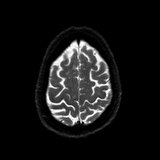
[im 78/86]
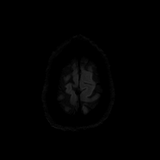
[im 86/86]
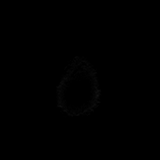

[Series 7: DWI · axial · 3.0mm · 1.44mm/px · z∈[-46,+90]mm · 7 of 43 slices shown (2 of 4)]
[im 1/43]
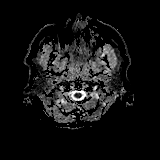
[im 8/43]
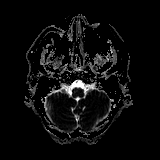
[im 15/43]
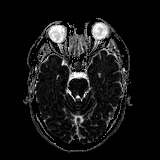
[im 22/43]
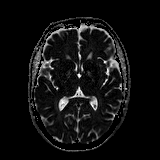
[im 29/43]
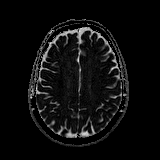
[im 36/43]
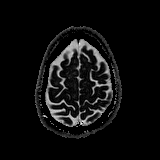
[im 43/43]
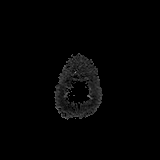

[Series 8: DWI · coronal · 5.0mm · 1.44mm/px · 10 of 68 slices shown (3 of 4)]
[im 1/68]
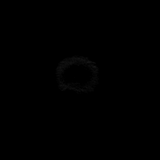
[im 8/68]
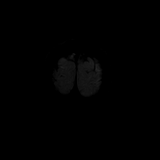
[im 15/68]
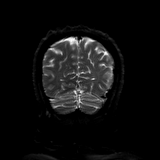
[im 23/68]
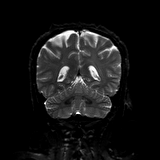
[im 30/68]
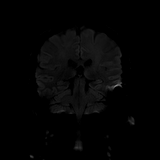
[im 38/68]
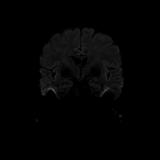
[im 45/68]
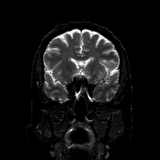
[im 53/68]
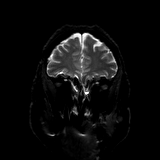
[im 60/68]
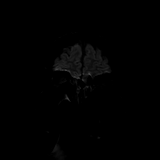
[im 68/68]
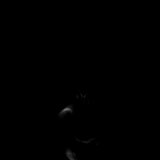

[Series 9: DWI · coronal · 5.0mm · 1.44mm/px · 5 of 34 slices shown (4 of 4)]
[im 1/34]
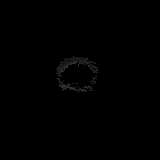
[im 9/34]
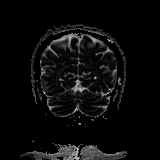
[im 17/34]
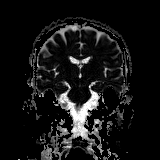
[im 25/34]
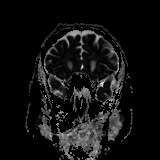
[im 34/34]
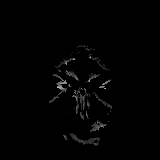

[Series 10: T2 · axial · 4.0mm · 0.36mm/px · z∈[-46,+92]mm · 4 of 28 slices shown]
[im 1/28]
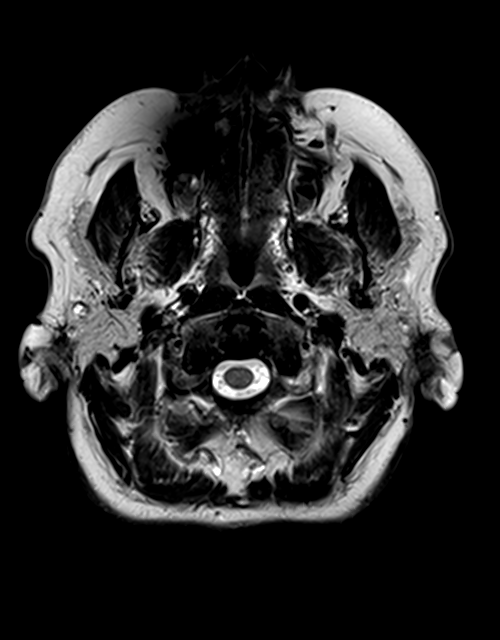
[im 10/28]
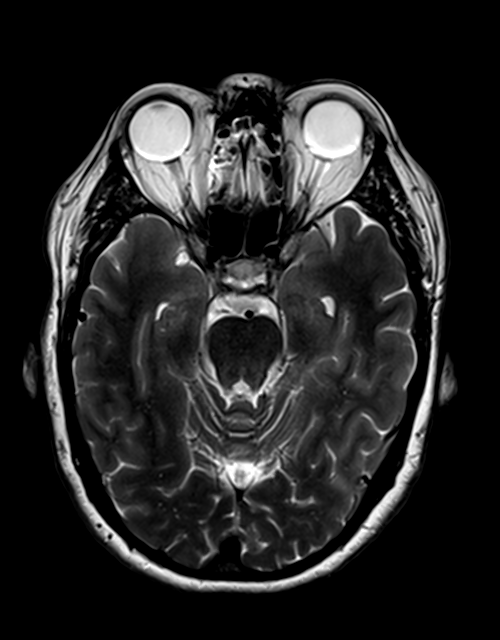
[im 19/28]
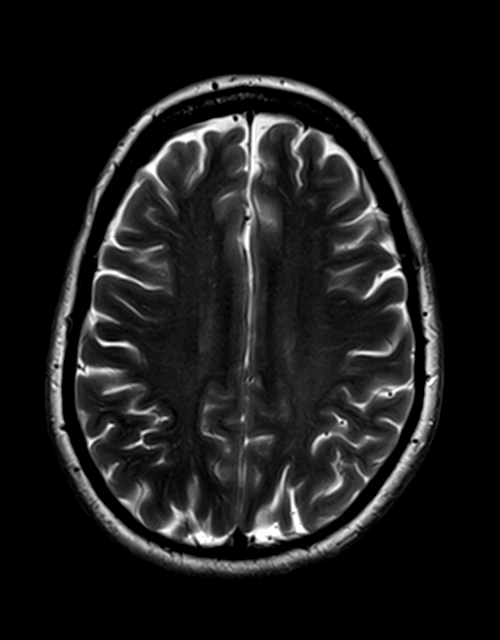
[im 28/28]
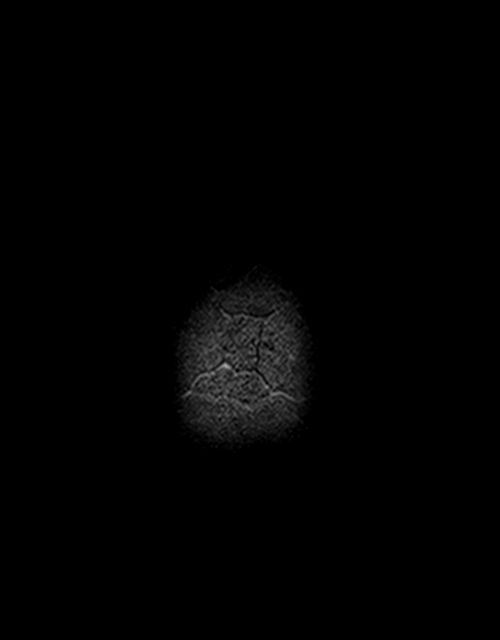

[Series 11: FLAIR · axial · 3.0mm · 0.72mm/px · z∈[-50,+97]mm · 4 of 26 slices shown]
[im 1/26]
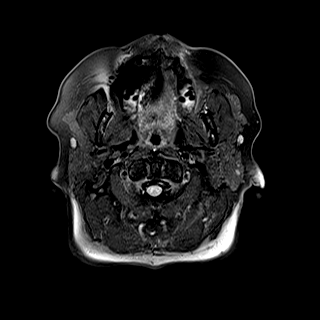
[im 9/26]
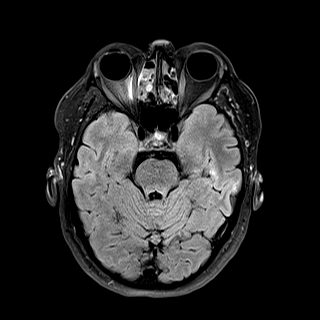
[im 17/26]
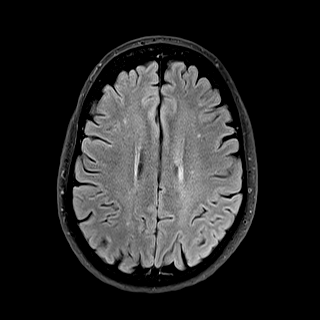
[im 26/26]
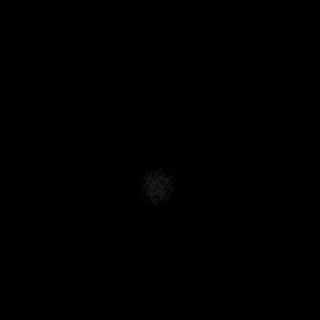

[48 of 48 positions shown; findings below may reference images not displayed]

FINDINGS: INTRACRANIAL CONTENTS: Subtly increased diffusivity LEFT hippocampus
without ADC abnormality; associated mildly bright FLAIR T2
hyperintense signal. No reduced diffusion to suggest acute infarct.
No susceptibility artifact to suggest hemorrhage. The ventricles and
sulci are normal for patient's age. A few scattered subcentimeter
supratentorial white matter FLAIR T2 hyperintensities in a
nonspecific, normal for age. No suspicious parenchymal signal,
masses, mass effect. No abnormal intraparenchymal or extra-axial
enhancement. No abnormal extra-axial fluid collections. No
extra-axial masses.

VASCULAR: Normal major intracranial vascular flow voids present at
skull base.

SKULL AND UPPER CERVICAL SPINE: No abnormal sellar expansion. No
suspicious calvarial bone marrow signal. ACDF. Craniocervical
junction maintained.

SINUSES/ORBITS: Mild paranasal sinus mucosal thickening. Trace LEFT
mastoid effusion.The included ocular globes and orbital contents are
non-suspicious.

OTHER: None.
IMPRESSION: 1. Subtle LEFT hippocampal signal abnormality could be 3 tesla
artifact though, can be seen with seen with early mesial temporal
sclerosis or postictal state. EEG or FDG PET may be of added value.
Recommend follow-up MRI of the head in 6 months to verify stability
of findings.
2. Otherwise negative MRI of the head with and without contrast for
age.

## 2018-12-08 ENCOUNTER — Ambulatory Visit: Payer: BLUE CROSS/BLUE SHIELD | Admitting: Certified Nurse Midwife

## 2019-01-20 ENCOUNTER — Ambulatory Visit (INDEPENDENT_AMBULATORY_CARE_PROVIDER_SITE_OTHER): Payer: BC Managed Care – PPO | Admitting: Certified Nurse Midwife

## 2019-01-20 ENCOUNTER — Encounter: Payer: Self-pay | Admitting: Certified Nurse Midwife

## 2019-01-20 ENCOUNTER — Other Ambulatory Visit: Payer: Self-pay

## 2019-01-20 VITALS — BP 120/80 | Ht 61.0 in | Wt 185.0 lb

## 2019-01-20 DIAGNOSIS — C4499 Other specified malignant neoplasm of skin, unspecified: Secondary | ICD-10-CM

## 2019-01-20 DIAGNOSIS — Z01419 Encounter for gynecological examination (general) (routine) without abnormal findings: Secondary | ICD-10-CM | POA: Diagnosis not present

## 2019-01-20 DIAGNOSIS — Z1272 Encounter for screening for malignant neoplasm of vagina: Secondary | ICD-10-CM

## 2019-01-20 DIAGNOSIS — Z1239 Encounter for other screening for malignant neoplasm of breast: Secondary | ICD-10-CM

## 2019-01-20 DIAGNOSIS — N8502 Endometrial intraepithelial neoplasia [EIN]: Secondary | ICD-10-CM

## 2019-01-20 NOTE — Progress Notes (Signed)
Gynecology Annual Exam  PCP: [facility]  Chief Complaint:  Chief Complaint  Patient presents with  . Gynecologic Exam    History of Present Illness:Laura Crane presents today for her annual exam. She is a 63 year old Caucasian/White female , G 0 P 0 0 0 0 , who is postmenopausal . She was diagnosed with extramammary Paget's disease 2 years ago and had a simple partial vulvectomy 03/11/2017. She did not need radiation or chemotherapy. In February 2019 she also had a rectocele repair (posterior colporrhaphy). On 08/09/2018 she had a TLH and BSO for complex atypical endometrial hyperplasia which was diagnosed after a D&C for PMB. She feels she has healed well from this most recent surgery. . She does have occasional hot flashes.  She has had no spotting since the surgery.  The patient's past medical history is also remarkable for hypothyroidism, high blood pressure, melanoma on her right upper arm (06/2016), kidney stones, and glaucoma.  Since her last annual GYN exam dated 11/16/2017, her thyroid medicine has been reduced (39mcg during the week and 100 mcg on weekends).  She is not sexually active.   Her most recent pap smear was obtained 11/16/2017 and was NIL/neg HRHPV.[name]  Her most recent mammogram obtained on 04/09/2018 was negative. There is a positive history of breast cancer in her sister and paternal aunt. Genetic testing has not been done. There is no family history of ovarian cancer. The patient does not do monthly self breast exams.  She had a colonoscopy in 06/29/2017. Results: tubular adenoma. Her next colonoscopy is due in 5 years.  She denies a recent DEXA scan and is eligible.  The patient does not smoke.  The patient does not drink alcohol.  The patient does not use illegal drugs.  The patient exercises on the treadmill three times a week for 20-30 minutes and works in the yard. The patient does get adequate calcium in her diet.  She had a recent  cholesterol screen in 2020  by Dr Richarda Overlie that was borderline elevated   Review of Systems: Review of Systems  Constitutional: Negative for chills, fever, malaise/fatigue and weight loss.  HENT: Negative for congestion, sinus pain and sore throat.   Eyes: Negative for blurred vision and pain.  Respiratory: Negative for hemoptysis, shortness of breath and wheezing.   Cardiovascular: Negative for chest pain, palpitations and leg swelling.  Gastrointestinal: Negative for abdominal pain, blood in stool, diarrhea, heartburn, nausea and vomiting.  Genitourinary: Negative for dysuria, frequency, hematuria and urgency.  Musculoskeletal: Negative for back pain, joint pain and myalgias.  Skin: Negative for itching and rash.  Neurological: Negative for dizziness, tingling and headaches.  Endo/Heme/Allergies: Positive for environmental allergies (with coughing and sneezing). Negative for polydipsia. Does not bruise/bleed easily.       Negative for hirsutism Positive for hot flashes  Psychiatric/Behavioral: Negative for depression. The patient is not nervous/anxious and does not have insomnia.     Past Medical History:  Past Medical History:  Diagnosis Date  . Complex atypical endometrial hyperplasia 06/2018  . Extramammary Paget disease 02/2017   having surgery to remove 03/11/17 from genital area  . Glaucoma   . Hypertension   . Hypothyroidism   . Kidney stones   . Melanoma ([place]) 06/2016   right arm  . Rectocele     Past Surgical History:  Past Surgical History:  Procedure Laterality Date  . COLONOSCOPY  01/06/2013   3 polyps  . COLONOSCOPY  W/ POLYPECTOMY  06/29/2017   tubular adenoma-next colonoscopy in 5 years  . excision of melanoma Right    right arm  . NECK SURGERY  06/02/2002   two discs replaced  . RECTAL PROLAPSE REPAIR  08/17/2017  . Simple partial vulvectomy  03/11/2017  . TOTAL LAPAROSCOPIC HYSTERECTOMY WITH BILATERAL SALPINGO OOPHORECTOMY  08/09/2018  . VULVA  /PERINEUM BIOPSY  2018   extramammary Paget's    Family History:  Family History  Problem Relation Age of Onset  . Breast cancer Sister 68  . Breast cancer Paternal Aunt 72  . Hypertension Mother   . Kidney failure Mother   . Hypertension Father   . Lung cancer Father   . Diabetes Brother   . Migraines Brother   . Hypertension Brother   . Heart attack Sister     Social History:  Social History   Socioeconomic History  . Marital status: Married    Spouse name: Not on file  . Number of children: Not on file  . Years of education: Not on file  . Highest education level: Not on file  Occupational History  . Not on file  Social Needs  . Financial resource strain: Not on file  . Food insecurity    Worry: Not on file    Inability: Not on file  . Transportation needs    Medical: Not on file    Non-medical: Not on file  Tobacco Use  . Smoking status: Never Smoker  . Smokeless tobacco: Never Used  Substance and Sexual Activity  . Alcohol use: No  . Drug use: No  . Sexual activity: Not Currently  Lifestyle  . Physical activity    Days per week: 3 days    Minutes per session: 30 min  . Stress: Only a little  Relationships  . Social Herbalist on phone: Not on file    Gets together: Not on file    Attends religious service: Not on file    Active member of club or organization: Not on file    Attends meetings of clubs or organizations: Not on file    Relationship status: Not on file  . Intimate partner violence    Fear of current or ex partner: Not on file    Emotionally abused: Not on file    Physically abused: Not on file    Forced sexual activity: Not on file  Other Topics Concern  . Not on file  Social History Narrative  . Not on file    Allergies:  Allergies  Allergen Reactions  . Azithromycin Swelling  . Doxycycline Anaphylaxis  . Doxycycline Calcium Anaphylaxis  . Erythromycin Other (See Comments)    Caused knee & lip swelling  .  Maprotiline     Other reaction(s): Unknown Other reaction(s): Unknown  . Amlodipine Rash  . Brimonidine Itching  . Hydrochlorothiazide Other (See Comments) and Rash    Rash on tongue Other reaction(s): Other (See Comments) Rash on tongue Rash on tongue Rash on tongue  . Macrolides And Ketolides Rash and Other (See Comments)    Other reaction(s): Other (See Comments), Unknown  . Penicillin V Potassium Rash  . Penicillins Rash  . Sulfa Antibiotics Rash  . Tetracyclines & Related Rash  . Troleandomycin Rash    Other reaction(s): Other (See Comments)    Medications:  Current Outpatient Medications on File Prior to Visit  Medication Sig Dispense Refill  . bimatoprost (LUMIGAN) 0.01 % SOLN 1 drop at bedtime.    [name]  levothyroxine (SYNTHROID) 88 MCG tablet Take on an empty stomach with a glass of water at least 30-60 minutes before breakfast M-F    . levothyroxine (SYNTHROID, LEVOTHROID) 100 MCG tablet Take by mouth.    [name] lisinopril (PRINIVIL,ZESTRIL) 10 MG tablet Take by mouth.    . Multiple Vitamins-Minerals (ICAPS AREDS 2) CAPS Take by mouth.    . Omega-3 Fatty Acids (FISH OIL) 1000 MG CAPS Take by mouth.    . Red Yeast Rice Extract 600 MG CAPS Take by mouth.    . timolol (BETIMOL) 0.5 % ophthalmic solution Apply to eye.    . vitamin B-12 (CYANOCOBALAMIN) 1000 MCG tablet Take by mouth.    . Vitamin D, Cholecalciferol, 25 MCG (1000 UT) TABS Take 1 tablet by mouth 2 (two) times daily.    [name] aspirin EC 81 MG tablet Take by mouth.    . verapamil (CALAN-SR) 240 MG CR tablet Take by mouth.     No current facility-administered medications on file prior to visit.      Physical Exam Vitals: BP 120/80   Wt 185 lb (83.9 kg)   BMI 34.96 kg/m   General: WF in NAD HEENT: normocephalic, anicteric Neck: no thyroid enlargement, no palpable nodules, no cervical lymphadenopathy  Pulmonary: No increased work of breathing, CTAB Cardiovascular: RRR, without murmur  Breast: Breast symmetrical,  no tenderness, no palpable nodules or masses, no skin or nipple retraction present, no nipple discharge.  No axillary, infraclavicular or supraclavicular lymphadenopathy. Abdomen: Soft, non-tender, non-distended.  Umbilicus without lesions.  No hepatomegaly or masses palpable. No evidence of hernia. Genitourinary:  External: Atrophic changes, no lesions or inflammation.  Normal urethral meatus, normal Bartholin's and Skene's glands.  Well healed scar on  Right inner labia majora.  Vagina:decreased rugae, no evidence of prolapse.    Cervix: surgically absent  Uterus: surgically absent  Adnexa: No adnexal masses, non-tender  Rectal: deferred  Lymphatic: no evidence of inguinal lymphadenopathy Extremities: no edema, erythema, or tenderness Neurologic: Grossly intact Psychiatric: mood appropriate, affect full     Assessment: 63 y.o. G1P0010 normal gyn exam s/p TLH and BSO  Plan:    1) Breast cancer screening - recommend monthly self breast exam and annual screening mammograms.. Mammogram due after 04/10/2019. Patient to schedule. Mammogram ordered today.  2) Osteoporosis prevention: Discussed calcium and vitamin D3 requirements. Also discussed the importance of strength training. Ask PCP about DEXA scan timing.  3) Cervical cancer screening - Pap was done. Per patient request  4) Colon cancer screening-colonoscopy UTD  5) Routine healthcare maintenance including cholesterol and diabetes screening managed by PCP   6) RTO in 1 year and prn.  Dalia Heading, CNM

## 2019-01-22 ENCOUNTER — Encounter: Payer: Self-pay | Admitting: Certified Nurse Midwife

## 2019-01-26 LAB — IGP,RFX APTIMA HPV ALL PTH

## 2019-04-11 ENCOUNTER — Ambulatory Visit
Admission: RE | Admit: 2019-04-11 | Discharge: 2019-04-11 | Disposition: A | Discharge status: Home / Self Care | Payer: BC Managed Care – PPO | Source: Ambulatory Visit | Attending: Certified Nurse Midwife | Admitting: Certified Nurse Midwife

## 2019-04-11 DIAGNOSIS — Z1231 Encounter for screening mammogram for malignant neoplasm of breast: Secondary | ICD-10-CM | POA: Insufficient documentation

## 2019-04-11 DIAGNOSIS — Z1239 Encounter for other screening for malignant neoplasm of breast: Secondary | ICD-10-CM | POA: Diagnosis present

## 2019-04-11 DIAGNOSIS — C4499 Other specified malignant neoplasm of skin, unspecified: Secondary | ICD-10-CM | POA: Diagnosis present

## 2020-01-03 ENCOUNTER — Encounter: Payer: Self-pay | Admitting: Certified Nurse Midwife

## 2020-01-25 ENCOUNTER — Other Ambulatory Visit: Payer: Self-pay

## 2020-01-25 ENCOUNTER — Encounter: Payer: Self-pay | Admitting: Certified Nurse Midwife

## 2020-01-25 ENCOUNTER — Ambulatory Visit (INDEPENDENT_AMBULATORY_CARE_PROVIDER_SITE_OTHER): Payer: BC Managed Care – PPO | Admitting: Certified Nurse Midwife

## 2020-01-25 VITALS — BP 124/70 | HR 89 | Resp 18 | Ht 61.0 in | Wt 182.6 lb

## 2020-01-25 DIAGNOSIS — Z01419 Encounter for gynecological examination (general) (routine) without abnormal findings: Secondary | ICD-10-CM | POA: Diagnosis not present

## 2020-01-25 DIAGNOSIS — Z1231 Encounter for screening mammogram for malignant neoplasm of breast: Secondary | ICD-10-CM

## 2020-01-25 NOTE — Progress Notes (Signed)
Gynecology Annual Exam  PCP: Dalia Heading, CNM  Chief Complaint:  Chief Complaint  Patient presents with   Gynecologic Exam    annual exam    History of Present Illness:Laura Crane presents today for her annual exam. She is a 64 year old Caucasian/White female , G 0 P 0 0 0 0 , who is postmenopausal . She was diagnosed with extramammary Paget's disease 3 years ago and had a simple partial vulvectomy 03/11/2017. She did not need radiation or chemotherapy. In February 2019 she also had a rectocele repair (posterior colporrhaphy). On 08/09/2018 she had a TLH and BSO for complex atypical endometrial hyperplasia which was diagnosed after a D&C for PMB.  On 01/05/2020 Dr Eugenia Mcalpine did a couple of biopsies, one on the vulva and another on the mons. Pathology was benign. 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 She continues to see Dr Marin Comment every 6 months.    She does not have bothersome  hot flashes. She has had no spotting.  The patient's past medical history is also remarkable for hypothyroidism, high blood pressure, melanoma on her right upper arm (06/2016), kidney stones, and glaucoma.  Since her last annual GYN exam dated 01/20/2019, she was begun on Zetia for her hyperlipidemia and developed a rash shortly after. She stopped the Zetia and the rash improved.  She is not sexually active.   Her most recent pap smear was obtained 01/20/2019 and was NIL.  Her most recent mammogram obtained on 04/11/19 was negative. There is a positive history of breast cancer in her sister and paternal aunt. Genetic testing has not been done. There is no family history of ovarian cancer. The patient does not do monthly self breast exams.  She had a colonoscopy in 06/29/2017. Results: tubular adenoma. Her next colonoscopy is due in 5 years.  She denies a recent DEXA scan and is eligible.  The patient does not smoke.  The patient does not drink alcohol.  The patient does not use illegal drugs.   The patient just started back exercising on the treadmill The patient does get adequate calcium in her diet.  She had a recent cholesterol screen in 2021  by Dr Richarda Overlie that was borderline elevated and her hemoglobin A1C was 7.3%. She is scheduled to see her PCP soon.   Review of Systems: Review of Systems  Constitutional: Negative for chills, fever, malaise/fatigue and weight loss.  HENT: Negative for congestion, sinus pain and sore throat.   Eyes: Negative for blurred vision and pain.  Respiratory: Negative for hemoptysis, shortness of breath and wheezing.   Cardiovascular: Negative for chest pain, palpitations and leg swelling.  Gastrointestinal: Negative for abdominal pain, blood in stool, diarrhea, heartburn, nausea and vomiting.  Genitourinary: Negative for dysuria, frequency, hematuria and urgency.  Musculoskeletal: Negative for back pain, joint pain and myalgias.  Skin: Positive for itching and rash.  Neurological: Negative for dizziness, tingling and headaches.  Endo/Heme/Allergies: Positive for environmental allergies (with coughing and sneezing). Negative for polydipsia. Does not bruise/bleed easily.  Psychiatric/Behavioral: Negative for depression. The patient is not nervous/anxious and does not have insomnia.     Past Medical History:  Past Medical History:  Diagnosis Date   Complex atypical endometrial hyperplasia 06/2018   Extramammary Paget disease 02/2017   having surgery to remove 03/11/17 from genital area   Glaucoma    Hypertension    Hypothyroidism    Kidney stones    Melanoma ([place]) 06/2016  right arm   Rectocele     Past Surgical History:  Past Surgical History:  Procedure Laterality Date   ABDOMINAL HYSTERECTOMY     COLONOSCOPY  01/06/2013   3 polyps   COLONOSCOPY W/ POLYPECTOMY  06/29/2017   tubular adenoma-next colonoscopy in 5 years   excision of melanoma Right    right arm   NECK SURGERY  06/02/2002   two discs replaced    OOPHORECTOMY     RECTAL PROLAPSE REPAIR  08/17/2017   Simple partial vulvectomy  03/11/2017   TOTAL LAPAROSCOPIC HYSTERECTOMY WITH BILATERAL SALPINGO OOPHORECTOMY  08/09/2018   VULVA /PERINEUM BIOPSY  2018   extramammary Paget's    Family History:  Family History  Problem Relation Age of Onset   Breast cancer Sister    Breast cancer Sister 79   Breast cancer Paternal Aunt 66   Hypertension Mother    Kidney failure Mother    Hypertension Father    Lung cancer Father    Diabetes Brother    Migraines Brother    Hypertension Brother    Heart attack Sister    Diabetes Sister     Social History:  Social History   Socioeconomic History   Marital status: Married    Spouse name: Not on file   Number of children: Not on file   Years of education: Not on file   Highest education level: Not on file  Occupational History   Not on file  Tobacco Use   Smoking status: Never Smoker   Smokeless tobacco: Never Used  Vaping Use   Vaping Use: Never used  Substance and Sexual Activity   Alcohol use: No   Drug use: No   Sexual activity: Not Currently  Other Topics Concern   Not on file  Social History Narrative   Not on file   Social Determinants of Health   Financial Resource Strain:    Difficulty of Paying Living Expenses:   Food Insecurity:    Worried About Charity fundraiser in the Last Year:    Arboriculturist in the Last Year:   Transportation Needs:    Film/video editor (Medical):    Lack of Transportation (Non-Medical):   Physical Activity:    Days of Exercise per Week:    Minutes of Exercise per Session:   Stress:    Feeling of Stress :   Social Connections:    Frequency of Communication with Friends and Family:    Frequency of Social Gatherings with Friends and Family:    Attends Religious Services:    Active Member of Clubs or Organizations:    Attends Archivist Meetings:    Marital Status:    Intimate Partner Violence:    Fear of Current or Ex-Partner:    Emotionally Abused:    Physically Abused:    Sexually Abused:     Allergies:  Allergies  Allergen Reactions   Azithromycin Swelling   Doxycycline Anaphylaxis   Doxycycline Calcium Anaphylaxis   Erythromycin Other (See Comments)    Caused knee & lip swelling   Maprotiline     Other reaction(s): Unknown Other reaction(s): Unknown   Amlodipine Rash   Brimonidine Itching   Hydrochlorothiazide Other (See Comments) and Rash    Rash on tongue Other reaction(s): Other (See Comments) Rash on tongue Rash on tongue Rash on tongue   Macrolides And Ketolides Rash and Other (See Comments)    Other reaction(s): Other (See Comments), Unknown   Penicillin  V Potassium Rash   Penicillins Rash   Sulfa Antibiotics Rash   Tetracyclines & Related Rash   Troleandomycin Rash    Other reaction(s): Other (See Comments)    Medications:  Current Outpatient Medications on File Prior to Visit  Medication Sig Dispense Refill   aspirin EC 81 MG tablet Take by mouth.     bimatoprost (LUMIGAN) 0.01 % SOLN 1 drop at bedtime.     levothyroxine (SYNTHROID) 88 MCG tablet Take on an empty stomach with a glass of water at least 30-60 minutes before breakfast M-F     levothyroxine (SYNTHROID, LEVOTHROID) 100 MCG tablet Take by mouth.     lisinopril (PRINIVIL,ZESTRIL) 10 MG tablet Take by mouth.     Multiple Vitamins-Minerals (ICAPS AREDS 2) CAPS Take by mouth.     Omega-3 Fatty Acids (FISH OIL) 1000 MG CAPS Take by mouth.     timolol (BETIMOL) 0.5 % ophthalmic solution Apply to eye.     verapamil (CALAN-SR) 240 MG CR tablet Take by mouth.     vitamin B-12 (CYANOCOBALAMIN) 1000 MCG tablet Take by mouth.     Vitamin D, Cholecalciferol, 25 MCG (1000 UT) TABS Take 1 tablet by mouth 2 (two) times daily.     No current facility-administered medications on file prior to visit.     Physical Exam Vitals: BP 124/70     Pulse 89    Resp 18    Ht 5\' 1"  (1.549 m)    Wt 182 lb 9.6 oz (82.8 kg)    SpO2 97%    BMI 34.50 kg/m   General: WF in NAD HEENT: normocephalic, anicteric Neck: no thyroid enlargement, no palpable nodules, no cervical lymphadenopathy  Pulmonary: No increased work of breathing, CTAB Cardiovascular: RRR, without murmur  Breast: Breast symmetrical, no tenderness, no palpable nodules or masses, no skin or nipple retraction present, no nipple discharge.  No axillary, infraclavicular or supraclavicular lymphadenopathy. Abdomen: Soft, non-tender, non-distended.  Umbilicus without lesions.  No hepatomegaly or masses palpable. No evidence of hernia. Genitourinary:  External: Atrophic changes. Biopsy scab/scar on mons.  Normal urethral meatus, normal Bartholin's and Skene's glands.   Vagina:decreased rugae, no evidence of prolapse.    Cervix: surgically absent  Uterus: surgically absent  Adnexa: No adnexal masses, non-tender  Rectal: deferred  Lymphatic: no evidence of inguinal lymphadenopathy Extremities: no edema, erythema, or tenderness Neurologic: Grossly intact Psychiatric: mood appropriate, affect full     Assessment: 64 y.o. G1P0010 normal gyn exam s/p TLH and BSO  Plan:    1) Breast cancer screening - recommend monthly self breast exam and annual screening mammograms.. Mammogram due after 9/2/82021. Patient to schedule. Mammogram ordered today.  2) Osteoporosis prevention: Discussed calcium and vitamin D3 requirements. Also discussed the importance of strength training. Ask PCP about DEXA scan timing.  3) Cervical cancer screening - Pap was not done. Desires testing every 3 years.  4) Colon cancer screening-colonoscopy UTD  5) Routine healthcare maintenance including cholesterol and diabetes screening managed by PCP   6) RTO in 1 year and prn.  Dalia Heading, CNM

## 2020-01-29 ENCOUNTER — Encounter: Payer: Self-pay | Admitting: Certified Nurse Midwife

## 2020-02-01 ENCOUNTER — Encounter: Payer: Self-pay | Admitting: Obstetrics and Gynecology

## 2020-04-11 ENCOUNTER — Ambulatory Visit
Admission: RE | Admit: 2020-04-11 | Discharge: 2020-04-11 | Disposition: A | Discharge status: Home / Self Care | Payer: BC Managed Care – PPO | Source: Ambulatory Visit | Attending: Certified Nurse Midwife | Admitting: Certified Nurse Midwife

## 2020-04-11 DIAGNOSIS — Z1231 Encounter for screening mammogram for malignant neoplasm of breast: Secondary | ICD-10-CM | POA: Diagnosis not present

## 2020-11-29 ENCOUNTER — Other Ambulatory Visit: Payer: Self-pay | Admitting: Family Medicine

## 2020-11-29 DIAGNOSIS — Z1231 Encounter for screening mammogram for malignant neoplasm of breast: Secondary | ICD-10-CM

## 2020-12-24 ENCOUNTER — Other Ambulatory Visit: Payer: Self-pay

## 2020-12-24 ENCOUNTER — Other Ambulatory Visit: Payer: Self-pay | Admitting: Family Medicine

## 2020-12-24 ENCOUNTER — Ambulatory Visit
Admission: RE | Admit: 2020-12-24 | Discharge: 2020-12-24 | Disposition: A | Discharge status: Home / Self Care | Payer: BC Managed Care – PPO | Source: Ambulatory Visit | Attending: Family Medicine | Admitting: Family Medicine

## 2020-12-24 DIAGNOSIS — R509 Fever, unspecified: Secondary | ICD-10-CM

## 2020-12-24 DIAGNOSIS — R1031 Right lower quadrant pain: Secondary | ICD-10-CM | POA: Diagnosis not present

## 2020-12-24 DIAGNOSIS — R110 Nausea: Secondary | ICD-10-CM | POA: Insufficient documentation

## 2020-12-24 DIAGNOSIS — G8929 Other chronic pain: Secondary | ICD-10-CM

## 2020-12-24 HISTORY — DX: Type 2 diabetes mellitus without complications: E11.9

## 2020-12-24 LAB — POCT I-STAT CREATININE: Creatinine, Ser: 0.7 mg/dL (ref 0.44–1.00)

## 2020-12-24 MED ORDER — IOHEXOL 300 MG/ML  SOLN
100.0000 mL | Freq: Once | INTRAMUSCULAR | Status: AC | PRN
  Administered 2020-12-24: 100 mL via INTRAVENOUS

## 2021-04-12 ENCOUNTER — Other Ambulatory Visit: Payer: Self-pay

## 2021-04-12 ENCOUNTER — Ambulatory Visit
Admission: RE | Admit: 2021-04-12 | Discharge: 2021-04-12 | Disposition: A | Discharge status: Home / Self Care | Payer: BC Managed Care – PPO | Source: Ambulatory Visit | Attending: Family Medicine | Admitting: Family Medicine

## 2021-04-12 DIAGNOSIS — Z1231 Encounter for screening mammogram for malignant neoplasm of breast: Secondary | ICD-10-CM | POA: Diagnosis not present

## 2021-08-29 DIAGNOSIS — E1169 Type 2 diabetes mellitus with other specified complication: Secondary | ICD-10-CM | POA: Diagnosis not present

## 2021-08-29 DIAGNOSIS — I10 Essential (primary) hypertension: Secondary | ICD-10-CM | POA: Diagnosis not present

## 2021-08-29 DIAGNOSIS — E785 Hyperlipidemia, unspecified: Secondary | ICD-10-CM | POA: Diagnosis not present

## 2021-08-29 DIAGNOSIS — E782 Mixed hyperlipidemia: Secondary | ICD-10-CM | POA: Diagnosis not present

## 2022-02-20 ENCOUNTER — Other Ambulatory Visit: Payer: Self-pay | Admitting: Family Medicine

## 2022-02-20 DIAGNOSIS — Z1231 Encounter for screening mammogram for malignant neoplasm of breast: Secondary | ICD-10-CM

## 2022-04-14 ENCOUNTER — Ambulatory Visit
Admission: RE | Admit: 2022-04-14 | Discharge: 2022-04-14 | Disposition: A | Discharge status: Home / Self Care | Payer: Medicare Other | Source: Ambulatory Visit | Attending: Family Medicine | Admitting: Family Medicine

## 2022-04-14 DIAGNOSIS — Z1231 Encounter for screening mammogram for malignant neoplasm of breast: Secondary | ICD-10-CM | POA: Diagnosis present

## 2022-09-26 ENCOUNTER — Ambulatory Visit: Payer: Medicare Other

## 2022-09-26 DIAGNOSIS — Z86010 Personal history of colonic polyps: Secondary | ICD-10-CM | POA: Diagnosis present

## 2022-09-26 DIAGNOSIS — K5730 Diverticulosis of large intestine without perforation or abscess without bleeding: Secondary | ICD-10-CM | POA: Diagnosis not present

## 2022-09-26 DIAGNOSIS — K640 First degree hemorrhoids: Secondary | ICD-10-CM | POA: Diagnosis not present

## 2023-03-02 ENCOUNTER — Other Ambulatory Visit: Payer: Self-pay | Admitting: Family Medicine

## 2023-03-02 DIAGNOSIS — Z1231 Encounter for screening mammogram for malignant neoplasm of breast: Secondary | ICD-10-CM

## 2023-04-16 ENCOUNTER — Ambulatory Visit
Admission: RE | Admit: 2023-04-16 | Discharge: 2023-04-16 | Disposition: A | Discharge status: Home / Self Care | Payer: Medicare Other | Source: Ambulatory Visit | Attending: Family Medicine | Admitting: Family Medicine

## 2023-04-16 DIAGNOSIS — Z1231 Encounter for screening mammogram for malignant neoplasm of breast: Secondary | ICD-10-CM | POA: Insufficient documentation

## 2024-03-03 ENCOUNTER — Other Ambulatory Visit: Payer: Self-pay | Admitting: Family Medicine

## 2024-03-03 DIAGNOSIS — Z1231 Encounter for screening mammogram for malignant neoplasm of breast: Secondary | ICD-10-CM

## 2024-04-21 ENCOUNTER — Ambulatory Visit
Admission: RE | Admit: 2024-04-21 | Discharge: 2024-04-21 | Disposition: A | Discharge status: Home / Self Care | Source: Ambulatory Visit | Attending: Family Medicine | Admitting: Family Medicine

## 2024-04-21 DIAGNOSIS — Z1231 Encounter for screening mammogram for malignant neoplasm of breast: Secondary | ICD-10-CM | POA: Insufficient documentation
